# Patient Record
Sex: Female | Born: 1955
Health system: Western US, Academic
[De-identification: ages and names within clinical notes are randomized; demographics above are authoritative.]

---

## 2017-05-01 ENCOUNTER — Inpatient Hospital Stay
Admit: 2017-05-01 | Discharge: 2017-05-01 | Disposition: A | Discharge status: Home / Self Care | Payer: BLUE CROSS/BLUE SHIELD | Source: Home / Self Care | Attending: Emergency Medical Services

## 2017-05-01 DIAGNOSIS — M79604 Pain in right leg: Secondary | ICD-10-CM

## 2017-05-01 DIAGNOSIS — M545 Low back pain: Secondary | ICD-10-CM

## 2018-06-20 ENCOUNTER — Telehealth: Payer: BLUE CROSS/BLUE SHIELD

## 2018-06-20 NOTE — Telephone Encounter
Wrong phone number- [facility] with page operator phone number.

## 2023-04-11 IMAGING — MR CORPO^MAMAS
24 series · 24 of 24 positions shown · IV contrast (C O N T R A S T E)
Comparison: none

[Series 1: localizador · axial · 6.0mm · 0.78mm/px · 1 of 4 slices shown]
[im 1/4]
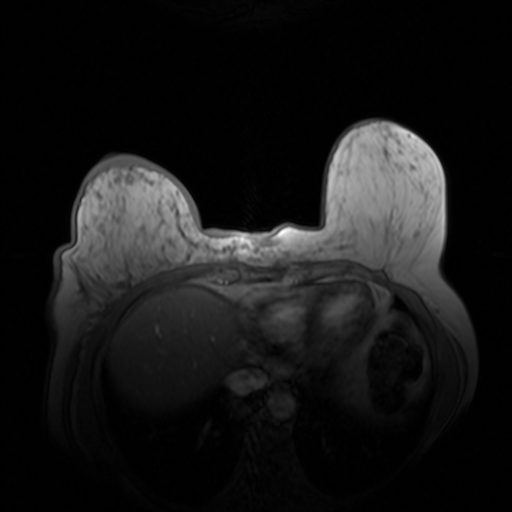

[Series 2: T2 · axial · 3.5mm · 1.12mm/px · 1 of 36 slices shown (1 of 2)]
[im 1/36]
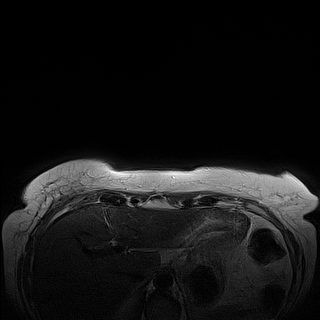

[Series 3: STIR · axial · 3.5mm · 1.12mm/px · 1 of 36 slices shown (1 of 3)]
[im 1/36]
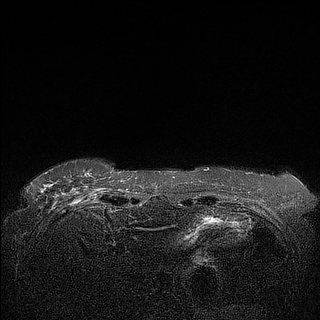

[Series 4: T1 · axial · 3.5mm · 0.42mm/px · 1 of 36 slices shown]
[im 1/36]
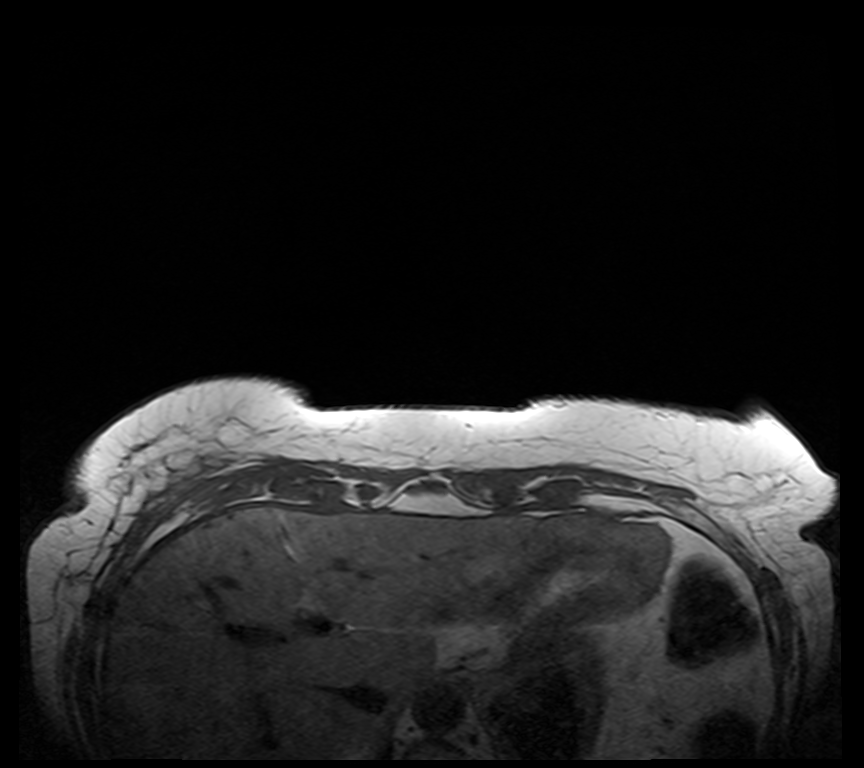

[Series 5: T2 · coronal · 3.5mm · 1.06mm/px · 1 of 35 slices shown (2 of 2)]
[im 1/35]
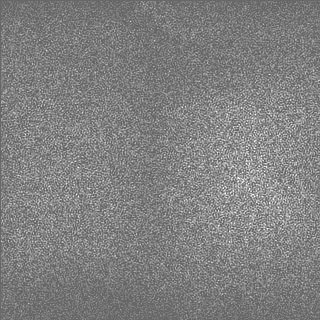

[Series 6: difusao_tracew · axial · 4.0mm · 1.82mm/px · 1 of 100 slices shown]
[im 1/100]
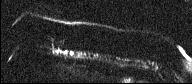

[Series 7: STIR · sagittal · 3.5mm · 0.90mm/px · 1 of 30 slices shown (2 of 3)]
[im 1/30]
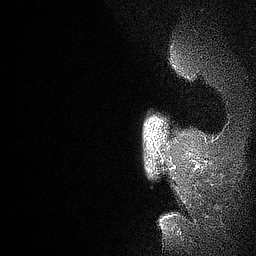

[Series 8: STIR · sagittal · 3.5mm · 0.90mm/px · 1 of 29 slices shown (3 of 3)]
[im 1/29]
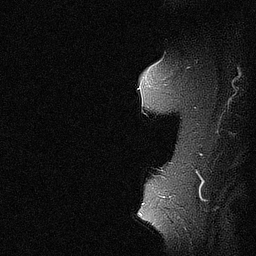

[Series 9: dinamico 1+-5 · axial · 1.0mm · 1.22mm/px · 1 of 192 slices shown (1 of 6)]
[im 1/192]
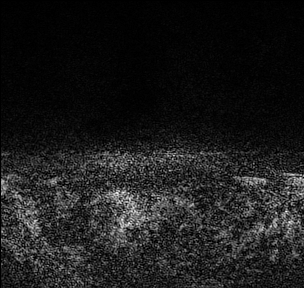

[Series 10: dinamico 1+-5 · axial · 1.0mm · 1.22mm/px · 1 of 192 slices shown (2 of 6)]
[im 1/192]
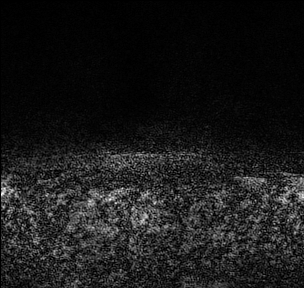

[Series 11: dinamico 1+-5_sub · axial · 1.0mm · 1.22mm/px · 1 of 192 slices shown (1 of 5)]
[im 1/192]
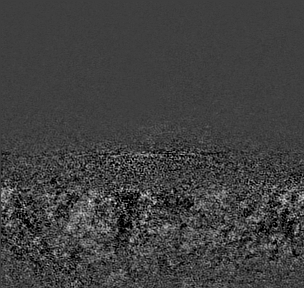

[Series 12: dinamico 1+-5_sub_mip_sag · sagittal · 369.4mm · 1.00mm/px · 1 of 5 slices shown]
[im 1/5]
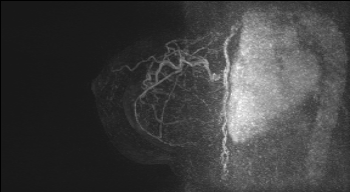

[Series 13: dinamico 1+-5_sub_mip_cor · coronal · 350.0mm · 1.00mm/px · 1 of 5 slices shown]
[im 1/5]
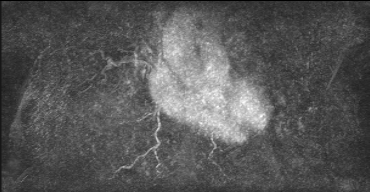

[Series 14: dinamico 1+-5_sub_mip_tra · axial · 192.0mm · 1.22mm/px · 1 of 5 slices shown]
[im 1/5]
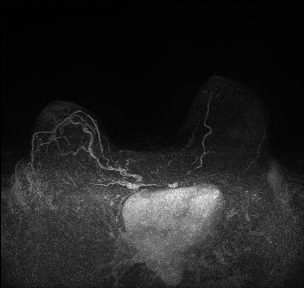

[Series 15: dinamico 1+-5 · axial · 1.0mm · 1.22mm/px · 1 of 192 slices shown (3 of 6)]
[im 1/192]
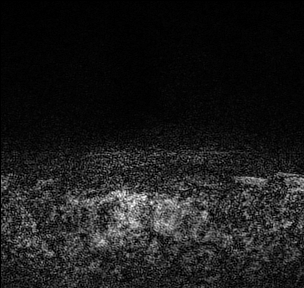

[Series 16: dinamico 1+-5_sub · axial · 1.0mm · 1.22mm/px · 1 of 192 slices shown (2 of 5)]
[im 1/192]
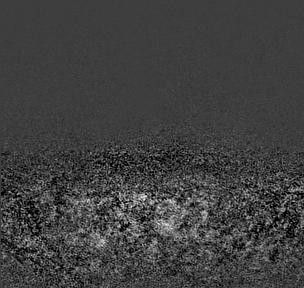

[Series 17: dinamico 1+-5 · axial · 1.0mm · 1.22mm/px · 1 of 192 slices shown (4 of 6)]
[im 1/192]
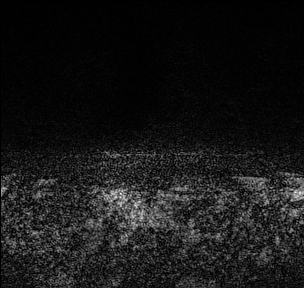

[Series 18: dinamico 1+-5_sub · axial · 1.0mm · 1.22mm/px · 1 of 192 slices shown (3 of 5)]
[im 1/192]
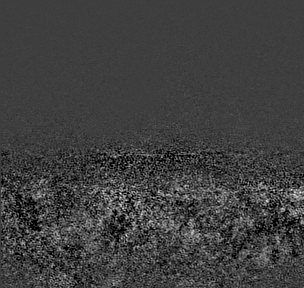

[Series 19: dinamico 1+-5 · axial · 1.0mm · 1.22mm/px · 1 of 192 slices shown (5 of 6)]
[im 1/192]
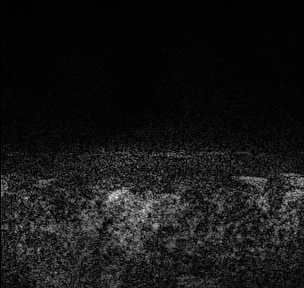

[Series 20: dinamico 1+-5_sub · axial · 1.0mm · 1.22mm/px · 1 of 192 slices shown (4 of 5)]
[im 1/192]
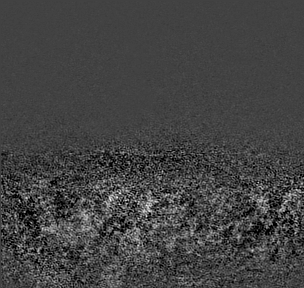

[Series 21: dinamico 1+-5 · axial · 1.0mm · 1.22mm/px · 1 of 192 slices shown (6 of 6)]
[im 1/192]
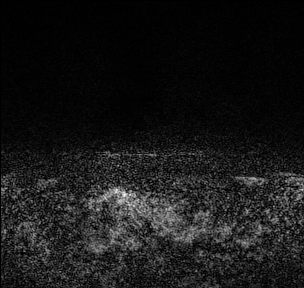

[Series 22: dinamico 1+-5_sub · axial · 1.0mm · 1.22mm/px · 1 of 192 slices shown (5 of 5)]
[im 1/192]
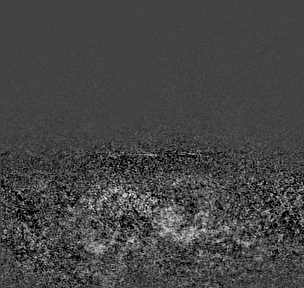

[Series 100: <mpr range> · coronal · 4.5mm · 1.06mm/px · 1 of 20 slices shown]
[im 1/20]
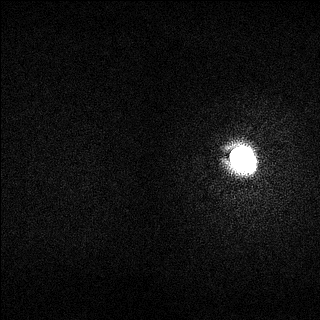

[Series 101: <mpr range(1)> · coronal · 4.5mm · 1.06mm/px · 1 of 20 slices shown]
[im 1/20]
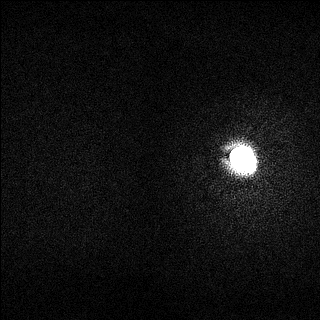

[24 of 24 positions shown; findings below may reference images not displayed]

Paciente:ANA V[employer]VIA [employer]
Indicação:
Controle. Cirurgia conservadora na mama direita.
Metodologia:
Exame realizado com a técnica [employer] e [employer] com imagens multiplanares em T1 e T2. Realizado
[facility]
sequências ponderadas em T1 após a injeção venosa do meio de contraste paramagnético e com subtração de
imagens.
Composição mamária:
Heterogeneamente fibroglandular.
O tecido fibroglandular apresenta discreto realce de fundo ao agente paramagnético.
RESSONÂNCIA MAGNÉTICA DE MAMAS
Análise:
Pele, tecido subcutâneo e complexos areolopapilares de morfologia e sinal habituais à esquerda.
Alteração da arquitetura habitual do parênquima mamário e axila decorrente manipulação cirúrgica prévia à direita,
associado à espessamento cutâneo difuso e redução volumétrica da mama em relação à contralateral.
Cisto simples medindo 0,9 cm localizado no quadrante ínferolateral da mama esquerda.
Após a administração do agente paramagnético não há áreas anômalas de realce sugerindo malignidade
bilateralmente.
Linfonodos axilares documentados de aspecto habitual ao método.
Musculatura peitoral e cadeias mamárias internas sem particularidades.
Comparação com exames anteriores:
Realizado a correlação com os exames convencionais, mamografia de 12/27/2022 e ultrassonografia de
Impressão Radiológica:
Macelo Valério
Paciente:ANA VÍV[employer]
BI-RADS RM® 2 - Achados benignos.
Recomendação:
Rastreamento de rotina de acordo com a faixa etária e grupo de risco.
[facility]
Macelo Valério
